# Patient Record
Sex: Female | Born: 1980 | Hispanic: No | Marital: Single | Smoking: Never smoker
Health system: Southern US, Community
[De-identification: ages and names within clinical notes are randomized; demographics above are authoritative.]

---

## 2003-03-03 ENCOUNTER — Encounter: Payer: Self-pay | Admitting: Emergency Medicine

## 2003-03-03 ENCOUNTER — Emergency Department (HOSPITAL_COMMUNITY): Admission: EM | Admit: 2003-03-03 | Discharge: 2003-03-03 | Payer: Self-pay | Admitting: Emergency Medicine

## 2014-08-28 ENCOUNTER — Emergency Department (HOSPITAL_COMMUNITY): Payer: Self-pay

## 2014-08-28 ENCOUNTER — Emergency Department (HOSPITAL_COMMUNITY)
Admission: EM | Admit: 2014-08-28 | Discharge: 2014-08-28 | Disposition: A | Discharge status: Home / Self Care | Payer: Self-pay | Attending: Emergency Medicine | Admitting: Emergency Medicine

## 2014-08-28 ENCOUNTER — Encounter (HOSPITAL_COMMUNITY): Payer: Self-pay

## 2014-08-28 DIAGNOSIS — Z880 Allergy status to penicillin: Secondary | ICD-10-CM | POA: Insufficient documentation

## 2014-08-28 DIAGNOSIS — Y998 Other external cause status: Secondary | ICD-10-CM | POA: Insufficient documentation

## 2014-08-28 DIAGNOSIS — S93402A Sprain of unspecified ligament of left ankle, initial encounter: Secondary | ICD-10-CM | POA: Insufficient documentation

## 2014-08-28 DIAGNOSIS — W25XXXA Contact with sharp glass, initial encounter: Secondary | ICD-10-CM | POA: Insufficient documentation

## 2014-08-28 DIAGNOSIS — Y9389 Activity, other specified: Secondary | ICD-10-CM | POA: Insufficient documentation

## 2014-08-28 DIAGNOSIS — S81011A Laceration without foreign body, right knee, initial encounter: Secondary | ICD-10-CM | POA: Insufficient documentation

## 2014-08-28 DIAGNOSIS — W19XXXA Unspecified fall, initial encounter: Secondary | ICD-10-CM

## 2014-08-28 DIAGNOSIS — Y9289 Other specified places as the place of occurrence of the external cause: Secondary | ICD-10-CM | POA: Insufficient documentation

## 2014-08-28 DIAGNOSIS — Z23 Encounter for immunization: Secondary | ICD-10-CM | POA: Insufficient documentation

## 2014-08-28 MED ORDER — TETANUS-DIPHTH-ACELL PERTUSSIS 5-2.5-18.5 LF-MCG/0.5 IM SUSP
0.5000 mL | Freq: Once | INTRAMUSCULAR | Status: AC
  Administered 2014-08-28: 0.5 mL via INTRAMUSCULAR
  Filled 2014-08-28: qty 0.5

## 2014-08-28 MED ORDER — LIDOCAINE-EPINEPHRINE (PF) 2 %-1:200000 IJ SOLN
10.0000 mL | Freq: Once | INTRAMUSCULAR | Status: AC
  Administered 2014-08-28: 10 mL via INTRADERMAL
  Filled 2014-08-28: qty 20

## 2014-08-28 MED ORDER — HYDROCODONE-ACETAMINOPHEN 5-325 MG PO TABS: 1.0000 | ORAL_TABLET | Freq: Four times a day (QID) | ORAL | Status: AC | PRN

## 2014-08-28 MED ORDER — HYDROCODONE-ACETAMINOPHEN 5-325 MG PO TABS
1.0000 | ORAL_TABLET | Freq: Once | ORAL | Status: AC
  Administered 2014-08-28: 1 via ORAL
  Filled 2014-08-28: qty 1

## 2014-08-28 NOTE — Discharge Instructions (Signed)
You have a laceration of your right knee.  Please have your sutures remove in 7 days.  Take pain medication as needed.  Wear brace on your left ankle for ankle sprain.  Esguince de tobillo  (Ankle Sprain)  Un esguince de tobillo es una lesin de los tejidos fuertes y fibrosos (ligamentos) que mantienen unidos los huesos del tobillo.  CUIDADOS EN EL HOGAR   Aplique hielo sobre el tobillo durante 1  2 [place], o segn lo que le indique su mdico.  Ponga el hielo en una bolsa plstica.  Colquese una toalla entre la piel y la bolsa de hielo.  Deje el hielo durante 15 a 20 minutos, cada 2 horas mientras se encuentre despierto.  Slo tome los medicamentos que le indique el mdico.  Eleve (levante) el tobillo lesionado por encima del nivel del corazn tanto como pueda durante 2 o 3 das.  Use muletas si el mdico se lo ha indicado. Lentamente lleve el peso sobre el tobillo afectado. Use muletas hasta que pueda soportar el peso sin dolor.  Si tiene una frula de yeso:  No lo apoye sobre nada que sea ms duro que una almohada durante 24 horas.  No ponga peso sobre la frula.  No lo moje.  Slo debe quitarlo para ducharse o baarse.  Si se lo indican, use un vendaje elstico o medias de descanso, como soporte. Qutese el vendaje si siente que pierde la sensibilidad (adormecimiento) de los dedos de los pies, o stos se vuelven azules o fros.  Si usted tiene una frula de aire:  Agregue o deje salir aire para que sea cmoda.  Quteselo por la noche y para ducharse o baarse.  Mueva los dedos de los pies y el tobillo hacia arriba y hacia abajo con frecuencia mientras lo est usando. SOLICITE AYUDA SI:  Le aumenta rpidamente el moretn o la inflamacin (hinchazn).  Los dedos de los pies estn fros.  Pierde la sensibilidad en los pies.  Los medicamentos no Editor, commissioning. SOLICITE AYUDA DE INMEDIATO SI:   Los dedos pierden la sensibilidad, estn (adormecidos) o de Geographical information systems officer.  Tiene un dolor intenso que va aumentando. ASEGRESE DE QUE:   Comprende estas instrucciones.  Controlar su enfermedad.  Solicitar ayuda de inmediato si no mejora o si empeora. Document Released: 05/13/2012 Document Revised: 01/03/2014 [facility] Patient Information 2015 [place], [place]. This information is not intended to replace advice given to you by your health care provider. Make sure you discuss any questions you have with your health care provider. Cuidados de Hotel manager - Adultos  (Laceration Care, Adult)  Una herida cortante es un corte o lesin que atraviesa todas las capas de la piel y el tejido que se encuentra debajo de la piel.  TRATAMIENTO  Algunas laceraciones no requieren sutura. Algunas no deben cerrarse debido a que puede aumentar el riesgo de infeccin. Es importante que consulte al mdico lo antes posible despus de recibir una lesin para minimizar el riesgo de infeccin y aumentar la posibilidad de que se cierre con xito.  Cuando se cierra adecuadamente, podrn indicarle analgsicos, si los necesita. La herida debe limpiarse para combatir la infeccin. El mdico usar puntos (suturas), [place], o tiras [place] para Environmental consultant. Estos elementos mantendrn unidos los bordes de la piel para que se cure ms rpidamente y para un mejor resultado cosmtico. Sin embargo, todas las heridas se curarn con una cicatriz. Una vez que la herida se haya curado, las cicatrices pueden minimizarse  cubriendo la herida con pantalla solar durante el da por un lapso se 1 ao.  INSTRUCCIONES PARA EL CUIDADO EN EL HOGAR  Si tiene puntos o grapas:   Mantenga la herida limpia y [place]seca.  Si tiene un (vendaje) cmbielo al menos una vez al da. Cmbielo si se moja o se ensucia, o segn las indicaciones del mdico.  Lave el corte dos veces por da con agua y Keenejabn. Enjuguelo con agua para quitar todo el Irenajabn. Seque dando palmaditas con una toalla limpia y  seca.  Despus de limpiar, aplique una delgada capa de una crema con antibitico segn las indicaciones del mdico. Esto le ayudar a prevenir las infecciones y a Automotive engineerevitar que el vendaje se Building services engineeradhiera.  Puede ducharse despus de las primeras 24 horas. No remoje la herida en agua hasta que le hayan quitado los puntos.  Solo tome medicamentos que se pueden comprar sin receta o recetados para Chief Technology Officerel dolor, Dentistmalestar o fiebre, como le indica el mdico.  Concurra para que le retiren los puntos o las grapas cuando el mdico le indique. En caso que tenga tiras ZOXWRUEAVadhesivas:   Mantenga la herida limpia y seca.  No deje que las tiras se mojen. Puede darse un bao cuidando de Devon Energymantener la herida seca.  Si se moja, squela dando palmaditas con una toalla limpia.  Las tiras caern por s mismas. Puede recortar las tiras a medida que la herida se [place]. No quite las tiras que estn pegadas a la herida. Ellas se caern cuando sea el momento. En caso que le hayan Deer Creekaplicado adhesivo.   Podr mojara momentneamente la herida en la ducha o el bao. No frote ni sumerja la herida. No practique natacin. Evite transpirar con abundancia hasta que el [place] Parkadhesivo se haya cado. Despus de ducharse o darse un bao, seque el corte dando palmaditas con una toalla limpia.  No aplique medicamentos lquidos, en crema o ungentos mientras el QUALCOMMadhesivo est en su lugar. Podr aflojarlo antes de que la herida se cure.  Si tiene un vendaje, tenga cuidado de no aplicar cinta adhesiva directamente Lehman Brotherssobre el adhesivo. Esto puede hacer que el Osceolaadhesivo se caiga antes de que la herida se haya curado.  Evite la exposicin prolongada a la luz del sol o a la International aid/development workerlmpara solar mientras en QUALCOMMadhesivo se Arts administratorencuentre en el lugar. La exposicin a los Hydrographic surveyorrayos ultravioletas durante el primer ao oscurecer la Training and development officercicatriz.  El Eastman Kodakadhesivo permanecer sobre la piel durante 5 a 10 das y Therapist, occupationalluego caer naturalmente. No quite la pelcula de Converseadhesivo. Deber aplicarse la vacuna  contra el ttanos si:  No recuerda cundo se coloc la vacuna la ltima vez.  Nunca recibi esta vacuna. Si le han aplicado la vacuna contra el ttanos, el brazo podr hincharse, enrojecer y sentirse caliente al tacto. Esto es frecuente y no es un problema. Si usted necesita aplicarse la vacuna y se niega a recibirla, corre riesgo de contraer ttanos. sta es una enfermedad grave.  SOLICITE ATENCIN MDICA SI:   Presenta enrojecimiento, hinchazn o aumento del dolor en la herida.  Hay rayas rojas que salen de la herida.  Observa un lquido blanco amarillento (pus) en la herida.  Tiene fiebre.  Advierte un olor ftido que proviene de la herida o del vendaje.  La herida se abre luego de que le han extrado las suturas.  Nota que en la herida hay algn cuerpo extrao como un trozo de Anaheimmadera o vidrio.  La herida est en su mano o pie y  observa que no Public house managerpuede mover correctamente los dedos. SOLICITE ATENCIN MDICA DE INMEDIATO SI:   El dolor no se alivia con los United Parcelmedicamentos.  Hay una zona muy hinchada alrededor de la herida que le causa dolor y adormecimiento, o advierte un cambio en el color en el brazo, la mano, la pierna o el pie.  La herida se abre y sangra nuevamente.  Siente que el adormecimiento, la debilidad o la prdida de la funcin de la articulacin que rodea la herida Ringgoldempeoran.  Palpa ndulos dolorosos cerca de la herida o bajo la piel en cualquier zona del cuerpo. ASEGRESE DE QUE:   Comprende estas instrucciones.  Controlar su enfermedad.  Solicitar ayuda de inmediato si no mejora o si empeora. Document Released: 08/19/2005 Document Revised: 11/11/2011 [facility] Patient Information 2015 [place], [place]. This information is not intended to replace advice given to you by your health care provider. Make sure you discuss any questions you have with your health care provider.

## 2014-08-28 NOTE — ED Provider Notes (Signed)
CSN: [id number]     Arrival date & time 08/28/14  1002 History  This chart was scribed for non-physician practitioner, Fayrene HelperBowie Brentley Horrell, PA-C working with Warnell Foresterrey Wofford, MD, by Abel PrestoKara Demonbreun, ED Scribe. This patient was seen in room TR08C/TR08C and the patient's care was started at 10:41 AM.     Chief Complaint  Patient presents with  . Joint Swelling  . Laceration    The history is provided by the patient. A language interpreter was used.    HPI Comments: Joann Parker is a 33 y.o. female who presents to the Emergency Department complaining of right leg laceration acquired at 9:30 AM today.  Pt notes a week ago she slipped and twisted her left ankle. Pt notes associated swelling and pain with ambulation. Today, pt states she was cleaning the porch when she used a glass table to help her keep weight off her ankle. The table fell causing her to tumble as well, falling onto the shattered glass.  Pt notes she does not know if she has glass embedded in her leg.  Pt was able to ambulate after. Pt is allergic to penicillin and ibuprofen.  Pt denies any other pain or injury, numbness, or fever.   History reviewed. No pertinent past medical history. History reviewed. No pertinent past surgical history. No family history on file. History  Substance Use Topics  . Smoking status: Never Smoker   . Smokeless tobacco: Not on file  . Alcohol Use: No   OB History    No data available     Review of Systems  Constitutional: Negative for fever.  Musculoskeletal: Positive for joint swelling and arthralgias.  Skin: Positive for wound.       No notice of foreign object  Neurological: Negative for numbness.      Allergies  Amoxicillin  Home Medications   Prior to Admission medications   Not on File   BP 112/64 mmHg  Pulse 86  Temp(Src) 98.5 F (36.9 C) (Oral)  Resp 20  SpO2 96%  LMP 08/14/2014 Physical Exam  Constitutional: She is oriented to person, place, and time. She appears  well-developed and well-nourished.  HENT:  Head: Normocephalic.  Eyes: Conjunctivae are normal.  Neck: Normal range of motion. Neck supple.  Cardiovascular:  Pedal pulses palpable  Pulmonary/Chest: Effort normal.  Musculoskeletal: Normal range of motion.       Right knee: She exhibits laceration (anterior right knee, 2.5 cm, superficial, no joint involvement). She exhibits normal range of motion.       Left ankle: She exhibits no deformity. Tenderness. Lateral malleolus (increasing pain with foot inversion) tenderness found.  Left ankle: Normal dorsi and plantar flexion No pitting edema  Right leg: Normal knee flexion and extension No foreign object noted Bruising noted to the distal anterior tib/fib of the right lower extremity  Neurological: She is alert and oriented to person, place, and time.  Sensations intact  Skin: Skin is warm and dry.  Psychiatric: She has a normal mood and affect. Her behavior is normal.  Nursing note and vitals reviewed.   ED Course  Procedures (including critical care time) DIAGNOSTIC STUDIES: Oxygen Saturation is 96% on room air, normal by my interpretation.    COORDINATION OF CARE: 10:49 AM Discussed treatment plan with patient at beside, the patient agrees with the plan and has no further questions at this time.  12:40 PM Pt with superficial lac to R knee without joint involvement.  Wound were cleansed and suture.  She has  tenderness to R distal tibfib with bruising noted.  Xray neg for acute fx.  She has tenderness to L ankle, no deformity and able to ambulate.  aso provide for stability.  Likely L ankle sprain.  Pt to f/u in 7 days for sutures removal.    Xray showing signs of possible retained glass.  i have personally tried removing any fb noted, and pt made aware possibility of retained fb.     Labs Review Labs Reviewed - No data to display  Imaging Review Dg Tibia/fibula Right  08/28/2014   CLINICAL DATA:  33 year old female with  laceration right lower leg after fall through glass table. Initial encounter.  EXAM: RIGHT TIBIA AND FIBULA - 2 VIEW  COMPARISON:  None.  FINDINGS: Multiple punctate to 7 mm hyperdense foci in the anterior right tib-fib soft tissues. Some or most of these probably are vascular phleboliths. Still, retained foreign bodies cannot be excluded. No subcutaneous gas identified. Underlying normal bone mineralization. Alignment at the right knee and ankle appears normal. No fracture of the right tibia or fibula.  IMPRESSION: 1.  No acute osseous abnormality identified in the right tib-fib. 2. Punctate to 7 mm hyperdense foci in the anterior soft tissues, at least some of these are vascular phleboliths, but retained glass fragments cannot be excluded. No subcutaneous gas identified.   Electronically Signed   By: Augusto GambleLee  Hall M.D.   On: 08/28/2014 12:45     EKG Interpretation None     LACERATION REPAIR Performed by: Fayrene HelperBowie Brittinie Wherley, PA-C Consent: Verbal consent obtained. Risks and benefits: risks, benefits and alternatives were discussed Patient identity confirmed: provided demographic data Time out performed prior to procedure Prepped and Draped in normal sterile fashion Wound explored Laceration Location: Anterior right knee Laceration Length: 2.5 cm No Foreign Bodies seen  Anesthesia: local infiltration Local anesthetic: lidocaine 2% with epinephrine Anesthetic total: 3 ml Irrigation method: syringe Amount of cleaning: standard Skin closure: sutures Number of sutures or staples: 6 Technique: simple interrupted Patient tolerance: Patient tolerated the procedure well with no immediate complications.   MDM   Final diagnoses:  Fall  Knee laceration, right, initial encounter  Ankle sprain, left, initial encounter   BP 112/64 mmHg  Pulse 86  Temp(Src) 98.5 F (36.9 C) (Oral)  Resp 20  SpO2 96%  LMP 08/14/2014  I have reviewed nursing notes and vital signs. I personally reviewed the imaging  tests through PACS system  I reviewed available ER/hospitalization records thought the EMR   I personally performed the services described in this documentation, which was scribed in my presence. The recorded information has been reviewed and is accurate.      Fayrene HelperBowie Kasey Ewings, PA-C 08/28/14 1241  Fayrene HelperBowie Twilla Khouri, PA-C 08/28/14 1302  Merrie RoofJohn David Wofford III, MD 08/30/14 [phone]

## 2014-08-28 NOTE — ED Notes (Signed)
Pt here for ankle pain and swelling to left ankle and laceration to right leg. The ankle has been swollen for about a week now and stepped in glass table cutting her leg today.

## 2016-01-10 IMAGING — CR DG TIBIA/FIBULA 2V*R*
2 series · 2 of 2 positions shown · non-contrast
Comparison: None.

CLINICAL DATA: 33-year-old female with laceration right lower leg
after fall through glass table. Initial encounter.

EXAM:
RIGHT TIBIA AND FIBULA - 2 VIEW

[tibia ap]
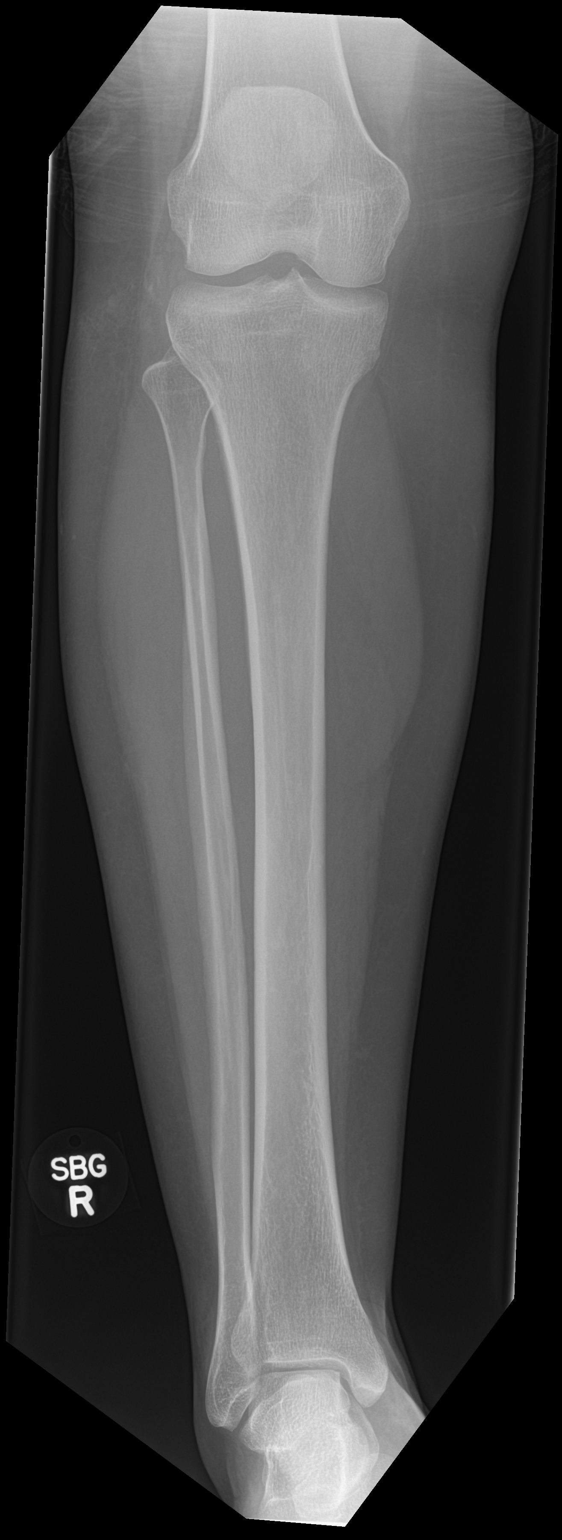

[tibia lat]
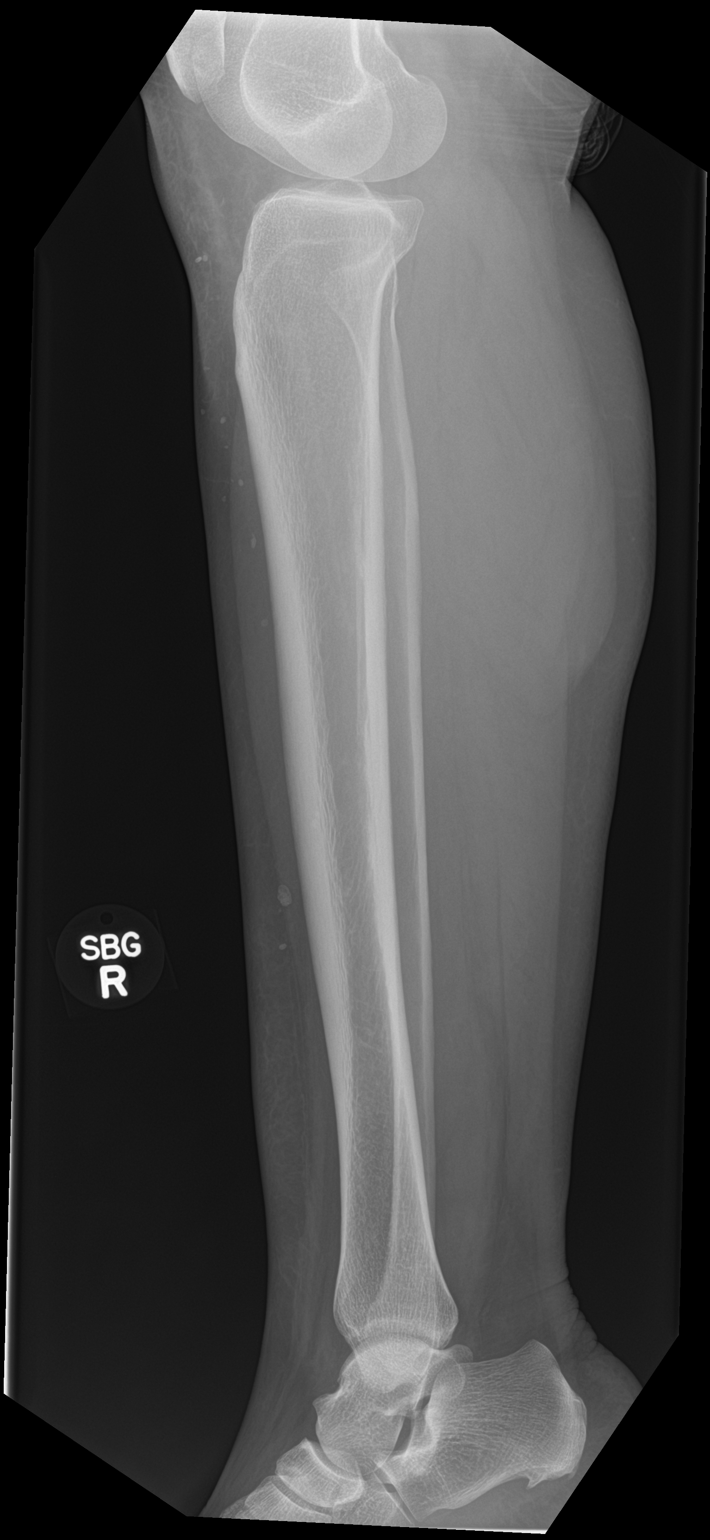

[2 of 2 positions shown; findings below may reference images not displayed]

FINDINGS: Multiple punctate to 7 mm hyperdense foci in the anterior right
tib-fib soft tissues. Some or most of these probably are vascular
phleboliths. Still, retained foreign bodies cannot be excluded. No
subcutaneous gas identified. Underlying normal bone mineralization.
Alignment at the right knee and ankle appears normal. No fracture of
the right tibia or fibula.
IMPRESSION: 1.  No acute osseous abnormality identified in the right tib-fib.
2. Punctate to 7 mm hyperdense foci in the anterior soft tissues, at
least some of these are vascular phleboliths, but retained glass
fragments cannot be excluded. No subcutaneous gas identified.
